# Patient Record
Sex: Male | Born: 1990 | Race: Black or African American | Hispanic: No | Marital: Single | Smoking: Current every day smoker
Health system: Southern US, Community
[De-identification: ages and names within clinical notes are randomized; demographics above are authoritative.]

---
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
## 2012-11-02 ENCOUNTER — Emergency Department: Payer: Self-pay | Admitting: Emergency Medicine

## 2014-01-07 ENCOUNTER — Emergency Department: Payer: Self-pay | Admitting: Emergency Medicine

## 2018-08-11 ENCOUNTER — Other Ambulatory Visit: Payer: Self-pay

## 2018-08-11 ENCOUNTER — Encounter: Payer: Self-pay | Admitting: Emergency Medicine

## 2018-08-11 ENCOUNTER — Emergency Department
Admission: EM | Admit: 2018-08-11 | Discharge: 2018-08-11 | Disposition: A | Discharge status: Home / Self Care | Payer: Self-pay | Attending: Emergency Medicine | Admitting: Emergency Medicine

## 2018-08-11 DIAGNOSIS — K029 Dental caries, unspecified: Secondary | ICD-10-CM | POA: Insufficient documentation

## 2018-08-11 DIAGNOSIS — F17210 Nicotine dependence, cigarettes, uncomplicated: Secondary | ICD-10-CM | POA: Insufficient documentation

## 2018-08-11 MED ORDER — LIDOCAINE VISCOUS HCL 2 % MT SOLN: 10.0000 mL | OROMUCOSAL | 0 refills | Status: DC | PRN

## 2018-08-11 MED ORDER — IBUPROFEN 600 MG PO TABS: 600.0000 mg | ORAL_TABLET | Freq: Four times a day (QID) | ORAL | 0 refills | Status: DC | PRN

## 2018-08-11 MED ORDER — LIDOCAINE VISCOUS HCL 2 % MT SOLN
15.0000 mL | Freq: Once | OROMUCOSAL | Status: AC
  Administered 2018-08-11: 15 mL via OROMUCOSAL
  Filled 2018-08-11: qty 15

## 2018-08-11 MED ORDER — AMOXICILLIN 500 MG PO CAPS: 500.0000 mg | ORAL_CAPSULE | Freq: Three times a day (TID) | ORAL | 0 refills | Status: DC

## 2018-08-11 MED ORDER — KETOROLAC TROMETHAMINE 30 MG/ML IJ SOLN
30.0000 mg | Freq: Once | INTRAMUSCULAR | Status: AC
  Administered 2018-08-11: 30 mg via INTRAMUSCULAR
  Filled 2018-08-11: qty 1

## 2018-08-11 NOTE — ED Triage Notes (Signed)
C/O left upper jaw dental pain x 3 days. 

## 2018-08-11 NOTE — ED Provider Notes (Signed)
[facility] Emergency Department Provider Note  ____________________________________________  Time seen: Approximately 8:39 AM  I have reviewed the triage vital signs and the nursing notes.   HISTORY  Chief Complaint Dental Pain    HPI Marcus Wheeler is a 27 y.o. male presents emergency department for evaluation of left top back dental pain for 1 day.  Patient does not have a dentist.  He states that he knows he has many cavities.  No fever, chills, difficulty opening or closing mouth.  History reviewed. No pertinent past medical history.  There are no active problems to display for this patient.   History reviewed. No pertinent surgical history.  Prior to Admission medications   Medication Sig Start Date End Date Taking? Authorizing Provider  amoxicillin (AMOXIL) 500 MG capsule Take 1 capsule (500 mg total) by mouth 3 (three) times daily. 08/11/18   Enid DerryWagner, Azlaan Isidore, PA-C  ibuprofen (ADVIL,MOTRIN) 600 MG tablet Take 1 tablet (600 mg total) by mouth every 6 (six) hours as needed. 08/11/18   Enid DerryWagner, Bellanie Matthew, PA-C  lidocaine (XYLOCAINE) 2 % solution Use as directed 10 mLs in the mouth or throat as needed for mouth pain. 08/11/18   Enid DerryWagner, Bernadine Melecio, PA-C    Allergies Patient has no known allergies.  No family history on file.  Social History Social History   Tobacco Use  . Smoking status: Current Every Day Smoker    Types: Cigarettes  . Smokeless tobacco: Never Used  Substance Use Topics  . Alcohol use: Not on file  . Drug use: Not on file     Review of Systems  Constitutional: No fever/chills Cardiovascular: No chest pain. Respiratory: No SOB. Gastrointestinal: No abdominal pain.  No nausea, no vomiting.  Musculoskeletal: Negative for musculoskeletal pain. Skin: Negative for rash, abrasions, lacerations, ecchymosis. Neurological: Negative for headaches   ____________________________________________   PHYSICAL EXAM:  VITAL SIGNS: ED  Triage Vitals  Enc Vitals Group     BP 08/11/18 0755 (!) 162/107     Pulse Rate 08/11/18 0755 64     Resp 08/11/18 0755 16     Temp 08/11/18 0755 98.6 F (37 C)     Temp Source 08/11/18 0755 Oral     SpO2 08/11/18 0755 93 %     Weight 08/11/18 0752 246 lb (111.6 kg)     Height --      Head Circumference --      Peak Flow --      Pain Score 08/11/18 0752 10     Pain Loc --      Pain Edu? --      Excl. in GC? --      Constitutional: Alert and oriented. Well appearing and in no acute distress. Eyes: Conjunctivae are normal. PERRL. EOMI. Head: Atraumatic. ENT:      Ears:      Nose: No congestion/rhinnorhea.      Mouth/Throat: Mucous membranes are moist.  Poor dentition.  Visible cavities to dentition. Tenderness to #12, 13. No swelling. No difficulty opening or closing mouth.  Neck: No stridor. Cardiovascular: Normal rate, regular rhythm.  Good peripheral circulation. Respiratory: Normal respiratory effort without tachypnea or retractions. Lungs CTAB. Good air entry to the bases with no decreased or absent breath sounds. Musculoskeletal: Full range of motion to all extremities. No gross deformities appreciated. Neurologic:  Normal speech and language. No gross focal neurologic deficits are appreciated.  Skin:  Skin is warm, dry and intact. No rash noted. Psychiatric: Mood and affect are  normal. Speech and behavior are normal. Patient exhibits appropriate insight and judgement.   ____________________________________________   LABS (all labs ordered are listed, but only abnormal results are displayed)  Labs Reviewed - No data to display ____________________________________________  EKG   ____________________________________________  RADIOLOGY   No results found.  ____________________________________________    PROCEDURES  Procedure(s) performed:    Procedures    Medications  ketorolac (TORADOL) 30 MG/ML injection 30 mg (30 mg Intramuscular Given 08/11/18  0856)  lidocaine (XYLOCAINE) 2 % viscous mouth solution 15 mL (15 mLs Mouth/Throat Given 08/11/18 0856)     ____________________________________________   INITIAL IMPRESSION / ASSESSMENT AND PLAN / ED COURSE  Pertinent labs & imaging results that were available during my care of the patient were reviewed by me and considered in my medical decision making (see chart for details).  Review of the [place] CSRS was performed in accordance of the [facility] prior to dispensing any controlled drugs.     Patient's diagnosis is consistent with dental caries.  Patient will be discharged home with prescriptions for amoxicillin to cover for infection. Patient is to follow up with dentist as directed.  Dental resources were given.  Patient is given ED precautions to return to the ED for any worsening or new symptoms.     ____________________________________________  FINAL CLINICAL IMPRESSION(S) / ED DIAGNOSES  Final diagnoses:  Dental caries      NEW MEDICATIONS STARTED DURING THIS VISIT:  ED Discharge Orders         Ordered    amoxicillin (AMOXIL) 500 MG capsule  3 times daily     08/11/18 0905    lidocaine (XYLOCAINE) 2 % solution  As needed     08/11/18 0905    ibuprofen (ADVIL,MOTRIN) 600 MG tablet  Every 6 hours PRN     08/11/18 0905              This chart was dictated using voice recognition software/Dragon. Despite best efforts to proofread, errors can occur which can change the meaning. Any change was purely unintentional.    Enid DerryWagner, Elnita Surprenant, PA-C 08/11/18 1203    Sharman CheekStafford, Phillip, MD 08/20/18 [phone]

## 2018-08-11 NOTE — Discharge Instructions (Addendum)
OPTIONS FOR DENTAL FOLLOW UP CARE ° °[place] Department of Health and Human Services - Local Safety Net Dental Clinics °http://www.ncdhhs.gov/dph/oralhealth/services/safetynetclinics.htm °  °[facility] (336-562-3123) ° °[facility] [place] (919-933-9087) ° °[facility] ([phone] ext [phone]) ° °[facility] ([phone]) ° °[facility] ([phone]) °This clinic caters to the indigent population and is on a lottery system. °Location: °[facility], [address], [place] °Clinic Hours: °Wednesdays from 6pm - 9pm, patients seen by a lottery system. °For dates, call or go to [email or web address].[facility].edu/[facility]/patients/Dental-[facility] °Services: °Cleanings, fillings and simple extractions. °Payment Options: °DENTAL WORK IS FREE OF CHARGE. Bring proof of income or support. °Best way to get seen: °Arrive at 5:15 pm - this is a lottery, NOT first come/first serve, so arriving earlier will not increase your chances of being seen. °  °  °[facility] °[phone] °Select option 1 for emergencies °  °Location: °[facility], [address], [place] °Clinic Hours: °No walk-ins accepted - call the day before to schedule an appointment. °Check in times are 9:30 am and 1:30 pm. °Services: °Simple extractions, temporary fillings, pulpectomy/pulp debridement, uncomplicated abscess drainage. °Payment Options: °PAYMENT IS DUE AT THE TIME OF SERVICE.  Fee is usually $100-200, additional surgical procedures (e.g. abscess drainage) may be extra. °Cash, checks, Visa/MasterCard accepted.  Can file Medicaid if patient is covered for dental - patient should call case worker to check. °No discount for [facility] patients. °Best way to get seen: °MUST call the day before and get onto the schedule. Can usually be seen the next 1-2 days. No walk-ins accepted. °  °  °[facility] °[phone] °   °Location: °[facility], [address], [place] °Clinic Hours: °M, W, Th, F 8am or 1:30pm, Tues 9a or 1:30 - first come/first served. °Services: °Simple extractions, temporary fillings, uncomplicated abscess drainage.  You do not need to be an [place] resident. °Payment Options: °PAYMENT IS DUE AT THE TIME OF SERVICE. °Dental insurance, otherwise sliding scale - bring proof of income or support. °Depending on income and treatment needed, cost is usually $50-200. °Best way to get seen: °Arrive early as it is first come/first served. °  °  °[facility] °[phone] °  °Location: °[address] °Clinic Hours: °Mon-Thu 8a-5p °Services: °Most basic dental services including extractions and fillings. °Payment Options: °PAYMENT IS DUE AT THE TIME OF SERVICE. °Sliding scale, up to 50% off - bring proof if income or support. °Medicaid with dental option accepted. °Best way to get seen: °Call to schedule an appointment, can usually be seen within 2 weeks OR they will try to see walk-ins - show up at 8a or 2p (you may have to wait). °  °  °[facility] °919-245-2435 °[place] RESIDENTS ONLY °  °Location: °[facility], [address], [place], [place] [address] °Clinic Hours: By appointment only. °Monday - Thursday 8am-5pm, Friday 8am-12pm °Services: Cleanings, fillings, extractions. °Payment Options: °PAYMENT IS DUE AT THE TIME OF SERVICE. °Cash, Visa or MasterCard. Sliding scale - $30 minimum per service. °Best way to get seen: °Come in to office, complete packet and make an appointment - need proof of income °or support monies for each household member and proof of [place] residence. °Usually takes about a month to get in. °  °  °[facility] °[phone] °  °Location: °[address]   [place] °Clinic Hours: Walk-in Urgent Care Dental Services are offered Monday-Friday  mornings only. °The numbers of emergencies accepted daily is limited to the number of °providers available. °Maximum 15 - Mondays, Wednesdays & Thursdays °Maximum 10 - Tuesdays & Fridays °Services: °You do not need to be a [place] resident to be seen for a dental emergency. °Emergencies are defined as pain, swelling, abnormal bleeding, or dental trauma. Walkins will receive x-rays if needed. °NOTE: Dental cleaning is not an emergency. °Payment Options: °PAYMENT IS DUE AT THE TIME OF SERVICE. °Minimum co-pay is $40.00 for uninsured patients. °Minimum co-pay is $3.00 for Medicaid with dental coverage. °Dental Insurance is accepted and must be presented at time of visit. °Medicare does not cover dental. °Forms of payment: Cash, credit card, checks. °Best way to get seen: °If not previously registered with the clinic, walk-in dental registration begins at 7:15 am and is on a first come/first serve basis. °If previously registered with the clinic, call to make an appointment. °  °  °[facility] °[phone] °[place] RESIDENTS ONLY °  °Location: °[address], [place], [place] °Clinic Hours: °Mon-Thu 10a-2p °Services: Extractions only! °Payment Options: °FREE (donations accepted) - bring proof of income or support °Best way to get seen: °Call and schedule an appointment OR come at 8am on the 1st Monday of every month (except for holidays) when it is first come/first served. °  °  °[facility] °[phone] °  °Location: °[address], [place] °Clinic Hours: °Friday mornings °Services, Payment Options, Best way to get seen: °Call for info °

## 2022-04-04 ENCOUNTER — Ambulatory Visit (INDEPENDENT_AMBULATORY_CARE_PROVIDER_SITE_OTHER): Payer: Self-pay | Admitting: Physician Assistant

## 2022-04-04 ENCOUNTER — Encounter: Payer: Self-pay | Admitting: Physician Assistant

## 2022-04-04 VITALS — BP 124/79 | HR 90 | Temp 98.3°F | Resp 16 | Ht 73.0 in | Wt 198.0 lb

## 2022-04-04 DIAGNOSIS — Z13228 Encounter for screening for other metabolic disorders: Secondary | ICD-10-CM

## 2022-04-04 DIAGNOSIS — Z136 Encounter for screening for cardiovascular disorders: Secondary | ICD-10-CM

## 2022-04-04 DIAGNOSIS — Z0000 Encounter for general adult medical examination without abnormal findings: Secondary | ICD-10-CM

## 2022-04-04 DIAGNOSIS — M7989 Other specified soft tissue disorders: Secondary | ICD-10-CM

## 2022-04-04 DIAGNOSIS — Z114 Encounter for screening for human immunodeficiency virus [HIV]: Secondary | ICD-10-CM

## 2022-04-04 DIAGNOSIS — Z23 Encounter for immunization: Secondary | ICD-10-CM

## 2022-04-04 DIAGNOSIS — Z1159 Encounter for screening for other viral diseases: Secondary | ICD-10-CM

## 2022-04-04 NOTE — Progress Notes (Unsigned)
I,Kortland Nichols Robinson,acting as a Neurosurgeon for [employer], PA-C.,have documented all relevant documentation on the behalf of Debera Lat, PA-C,as directed by  [employer], PA-C while in the presence of [employer], PA-C.  New Patient Office Visit  Subjective    Patient ID: Marcus Wheeler, male    DOB: 08/29/1990  Age: 31 y.o. MRN: [id number]  CC:  Chief Complaint  Patient presents with   Establish Care/annual physical  Patient is eating a regular diet.  He is not exercising. Patient is feeling well. He is sleeping well. Patient does has concern to discuss today.    HPI Marcus Wheeler is a 31 yr old male presenting to establish care.  Patient reports cyst-like lump under chin.  Believes it's an ingrown hair.  Noticed approximately 1 yr ago.  Can at times be uncomfortable.     Outpatient Encounter Medications as of 04/04/2022  Medication Sig   amoxicillin (AMOXIL) 500 MG capsule Take 1 capsule (500 mg total) by mouth 3 (three) times daily.   ibuprofen (ADVIL,MOTRIN) 600 MG tablet Take 1 tablet (600 mg total) by mouth every 6 (six) hours as needed.   lidocaine (XYLOCAINE) 2 % solution Use as directed 10 mLs in the mouth or throat as needed for mouth pain. (Patient not taking: Reported on 04/04/2022)   No facility-administered encounter medications on file as of 04/04/2022.    History reviewed. No pertinent past medical history.  History reviewed. No pertinent surgical history.  History reviewed. No pertinent family history.  Social History   Socioeconomic History   Marital status: Single    Spouse name: Not on file   Number of children: Not on file   Years of education: Not on file   Highest education level: Not on file  Occupational History   Not on file  Tobacco Use   Smoking status: Every Day    Packs/day: 1.50    Types: Cigarettes   Smokeless tobacco: Never  Vaping Use   Vaping Use: Not on file  Substance and Sexual Activity   Alcohol use: Yes   Drug use: Yes     Types: Marijuana   Sexual activity: Yes    Birth control/protection: Condom  Other Topics Concern   Not on file  Social History Narrative   Not on file   Social Determinants of Health   Financial Resource Strain: Not on file  Food Insecurity: Not on file  Transportation Needs: Not on file  Physical Activity: Not on file  Stress: Not on file  Social Connections: Not on file  Intimate Partner Violence: Not on file    Review of Systems  All other systems reviewed and are negative.  Except lump under the chin     Objective    BP 124/79 (BP Location: Right Arm, Patient Position: Sitting, Cuff Size: Normal)   Pulse 90   Temp 98.3 F (36.8 C) (Oral)   Resp 16   Ht 6\' 1"  (1.854 m)   Wt 198 lb (89.8 kg)   SpO2 100%   BMI 26.12 kg/m   Physical Exam Vitals reviewed.  Constitutional:      General: He is not in acute distress.    Appearance: Normal appearance. He is well-developed. He is not diaphoretic.  HENT:     Head: Normocephalic and atraumatic.     Right Ear: Tympanic membrane, ear canal and external ear normal.     Left Ear: Tympanic membrane, ear canal and external ear normal.  Nose: Nose normal.     Mouth/Throat:     Mouth: Mucous membranes are moist.     Pharynx: Oropharynx is clear. No oropharyngeal exudate.  Eyes:     General: No scleral icterus.    Conjunctiva/sclera: Conjunctivae normal.     Pupils: Pupils are equal, round, and reactive to light.  Neck:     Thyroid: No thyromegaly.  Cardiovascular:     Rate and Rhythm: Normal rate and regular rhythm.     Pulses: Normal pulses.     Heart sounds: Normal heart sounds. No murmur heard. Pulmonary:     Effort: Pulmonary effort is normal. No respiratory distress.     Breath sounds: Normal breath sounds. No wheezing or rales.  Abdominal:     General: There is no distension.     Palpations: Abdomen is soft.     Tenderness: There is no abdominal tenderness.  Musculoskeletal:        General: No  deformity.     Cervical back: Neck supple.     Right lower leg: No edema.     Left lower leg: No edema.  Lymphadenopathy:     Cervical: No cervical adenopathy.  Skin:    General: Skin is warm and dry.     Findings: No rash.  Neurological:     Mental Status: He is alert and oriented to person, place, and time. Mental status is at baseline.     Sensory: No sensory deficit.     Motor: No weakness.     Gait: Gait normal.  Psychiatric:        Mood and Affect: Mood normal.        Behavior: Behavior normal.        Thought Content: Thought content normal.     {Labs (Optional):23779}    Assessment & Plan:   Problem List Items Addressed This Visit   None 1. Encounter for special screening examination for cardiovascular disorder  - Lipid panel - CBC with Differential/Platelet - Comprehensive metabolic panel  2. Need for hepatitis C screening test  - Hepatitis C antibody  3. Encounter for screening for HIV  - HIV Antibody (routine testing w rflx)  4. Need for tetanus, diphtheria, and acellular pertussis (Tdap) vaccine in patient of adolescent age or older  - Administer Tetanus-diphtheria-acellular pertussis (Tdap) vaccine  5. Screening for Cardiovascular disorder     Screening for metabolic disorder -Lipid panel  6. Cyst of soft tissue Under his chin and on the V right toe - Ambulatory referral to General Surgery  FU PRN The patient was advised to call back or seek an in-person evaluation if the symptoms worsen or if the condition fails to improve as anticipated.  I discussed the assessment and treatment plan with the patient. The patient was provided an opportunity to ask questions and all were answered. The patient agreed with the plan and demonstrated an understanding of the instructions.  The entirety of the information documented in the History of Present Illness, Review of Systems and Physical Exam were personally obtained by me. Portions of this information were  initially documented by the CMA and reviewed by me for thoroughness and accuracy.  Portions of this note were created using dictation software and may contain typographical errors.  Debera Lat, [facility], [facility], [place] [phone] (phone) [phone] (fax)

## 2022-04-05 DIAGNOSIS — Z114 Encounter for screening for human immunodeficiency virus [HIV]: Secondary | ICD-10-CM | POA: Insufficient documentation

## 2022-04-05 DIAGNOSIS — Z23 Encounter for immunization: Secondary | ICD-10-CM | POA: Insufficient documentation

## 2022-04-05 DIAGNOSIS — Z0000 Encounter for general adult medical examination without abnormal findings: Secondary | ICD-10-CM | POA: Insufficient documentation

## 2022-04-05 DIAGNOSIS — Z13228 Encounter for screening for other metabolic disorders: Secondary | ICD-10-CM | POA: Insufficient documentation

## 2022-04-05 DIAGNOSIS — Z1159 Encounter for screening for other viral diseases: Secondary | ICD-10-CM | POA: Insufficient documentation

## 2022-04-20 ENCOUNTER — Encounter: Payer: Self-pay | Admitting: Surgery

## 2022-04-20 ENCOUNTER — Ambulatory Visit (INDEPENDENT_AMBULATORY_CARE_PROVIDER_SITE_OTHER): Payer: Self-pay | Admitting: Surgery

## 2022-04-20 ENCOUNTER — Other Ambulatory Visit: Payer: Self-pay

## 2022-04-20 VITALS — BP 146/88 | HR 81 | Temp 98.2°F | Ht 73.0 in | Wt 201.0 lb

## 2022-04-20 DIAGNOSIS — L723 Sebaceous cyst: Secondary | ICD-10-CM

## 2022-04-20 MED ORDER — SULFAMETHOXAZOLE-TRIMETHOPRIM 800-160 MG PO TABS: 1.0000 | ORAL_TABLET | Freq: Two times a day (BID) | ORAL | 0 refills | Status: DC

## 2022-04-20 NOTE — Patient Instructions (Signed)
Please see your follow up appointment listed below.     Epidermoid Cyst Removal Epidermoid cyst removal is a procedure to remove a fluid-filled sac that forms under your skin (epidermoid cyst). This type of cyst is filled with a thick, oily substance (keratin) that is secreted by your skin glands. Epidermoid cysts may also be called epidermal cysts, or keratin cysts. Normally, the skin secretes this pasty material through a gland or a hair follicle. However, when a skin gland or hair follicle becomes blocked, an epidermoid cyst can form. You may need this procedure if you have an epidermal cyst that becomes large, uncomfortable, or inflamed. Tell a health care provider about: Any allergies you have. All medicines you are taking, including vitamins, herbs, eye drops, creams, and over-the-counter medicines. Any problems you or family members have had with anesthetic medicines. Any blood disorders you have. Any surgeries you have had. Any medical conditions you have now or have had. Whether you are pregnant or may be pregnant. What are the risks? Generally, this is a safe procedure. However, problems may occur, including: Recurrence of the cyst. Bleeding. Infection. Scarring. What happens before the procedure? Ask your health care provider about: Changing or stopping your regular medicines. This is especially important if you are taking diabetes medicines or blood thinners. Taking medicines such as aspirin and ibuprofen. These medicines can thin your blood. Do not take these medicines unless your health care provider tells you to take them. Taking over-the-counter medicines, vitamins, herbs, and supplements. If you have an inflamed or infected cyst, you may have to take antibiotic medicine before the cyst removal. Take your antibiotic as told by your health care provider. Do not stop taking the antibiotic even if you start to feel better. Take a shower on the morning of your procedure. Your  health care provider may ask you to use a germ-killing soap. What happens during the procedure?  You will be given a medicine to numb the area (local anesthetic). The skin around the cyst will be cleaned with a germ-killing solution. The health care provider will make a small incision in your skin over the cyst. The health care provider will separate the cyst from the surrounding tissues that are under your skin. If possible, the cyst will be removed undamaged (intact). If the cyst bursts (ruptures), it will be removed in pieces. After the cyst is removed, the health care provider will control any bleeding and close the incision with small stitches (sutures). Small incisions may not need sutures, and the bleeding will be controlled by applying direct pressure with gauze. The health care provider may apply antibiotic ointment and a bandage (dressing) over the incision. The procedure may vary among health care providers and hospitals. What happens after the procedure? If you are prescribed an antibiotic medicine or ointment, take or apply it as told by your health care provider. Do not stop using the antibiotic even if you start to feel better. Summary Epidermoid cyst removal is a procedure to remove a sac that has formed under your skin. You may need this procedure if you have an epidermoid cyst that becomes large, uncomfortable, or inflamed. The health care provider will make a small incision in your skin to remove the cyst. If you are prescribed an antibiotic medicine before the procedure, after the procedure, or both, use the antibiotic as told by your health care provider. Do not stop using the antibiotic even if you start to feel better. This information is not intended to  replace advice given to you by your health care provider. Make sure you discuss any questions you have with your health care provider. Document Revised: 11/06/2019 Document Reviewed: 11/06/2019 Elsevier Patient Education   2023 [employer].

## 2022-04-20 NOTE — Progress Notes (Signed)
04/20/2022  Reason for Visit:  Sebaceous cyst of the chin  Requesting Provider:  Debera Lat, PA-C  History of Present Illness: Marcus Wheeler is a 31 y.o. male presenting for evaluation of a sebaceous cyst under the chin.  The patient reports that he's had this for a couple years, and has been fluctuating in size, sometimes being smaller and sometimes being larger.  Typically when it flares up, it goes down on its own.  He recently saw his PCP on 04/04/22 and at the time the cyst was not going down in size.  That same night, the cyst burst and drained some on its own.  He reports having some purulent fluid drainage.  Since then, the skin has healed but the cyst is starting to grow in size again.  Denies any worsening pain at this point.  Denies any fevers, chills, chest pain, shortness of breath.  Past Medical History: History reviewed. No pertinent past medical history.   Past Surgical History: History reviewed. No pertinent surgical history.  Home Medications: Prior to Admission medications   Medication Sig Start Date End Date Taking? Authorizing Provider  sulfamethoxazole-trimethoprim (BACTRIM DS) 800-160 MG tablet Take 1 tablet by mouth 2 (two) times daily. 04/20/22  Yes Reyli Schroth, Elita Quick, MD    Allergies: No Known Allergies  Social History:  reports that he has been smoking cigarettes. He has been smoking an average of 1.5 packs per day. He has never used smokeless tobacco. He reports current alcohol use. He reports current drug use. Drug: Marijuana.   Family History: Family History  Problem Relation Age of Onset   Diabetes Father     Review of Systems: Review of Systems  Constitutional:  Negative for chills and fever.  Respiratory:  Negative for shortness of breath.   Cardiovascular:  Negative for chest pain.  Gastrointestinal:  Negative for abdominal pain, nausea and vomiting.  Skin:        Cyst under his chin, with recent drainage.    Physical Exam BP (!) 146/88    Pulse 81   Temp 98.2 F (36.8 C) (Oral)   Ht 6\' 1"  (1.854 m)   Wt 201 lb (91.2 kg)   SpO2 99%   BMI 26.52 kg/m  CONSTITUTIONAL: No acute distress, well nourished. HEENT:  Normocephalic, atraumatic, extraocular motion intact. RESPIRATORY:  Normal respiratory effort without pathologic use of accessory muscles. CARDIOVASCULAR: Regular rhythm and rate. MUSCULOSKELETAL:  Normal muscle strength and tone in all four extremities.  No peripheral edema or cyanosis. SKIN: The patient has an approximately 2 cm mass under his chin, which is soft and fluctuant, consistent with a sebaceous cyst.  No active drainage at this point.  He has some other skin pores/blackheads around his skin. NEUROLOGIC:  Motor and sensation is grossly normal.  Cranial nerves are grossly intact. PSYCH:  Alert and oriented to person, place and time. Affect is normal.  Laboratory Analysis: No results found for this or any previous visit (from the past 24 hour(s)).  Imaging: No results found.  Assessment and Plan: This is a 31 y.o. male with a sebaceous cyst under his chin.  --Discussed with patient that the mass under his chin is a sebaceous cyst and discussed how they can form and get infected.  Based on his description, he's had minor flareups of infection that resolve on their own, and the cyst recently drained spontaneously.  As a precaution, will give him a course of Bactrim x 7 days and will have him come back  in 2 weeks for formal cyst excision in the office.  Discussed the procedure with him and the reasoning for doing antibiotics and waiting two weeks prior to excision, and he understands. --Follow up in 2 weeks.  I spent 30 minutes dedicated to the care of this patient on the date of this encounter to include pre-visit review of records, face-to-face time with the patient discussing diagnosis and management, and any post-visit coordination of care.   Marcus Ill, MD  [facility]

## 2022-05-04 ENCOUNTER — Ambulatory Visit (INDEPENDENT_AMBULATORY_CARE_PROVIDER_SITE_OTHER): Payer: Self-pay | Admitting: Surgery

## 2022-05-04 ENCOUNTER — Encounter: Payer: Self-pay | Admitting: Surgery

## 2022-05-04 VITALS — BP 116/75 | HR 85 | Temp 98.3°F | Ht 73.0 in | Wt 200.0 lb

## 2022-05-04 DIAGNOSIS — L723 Sebaceous cyst: Secondary | ICD-10-CM

## 2022-05-04 NOTE — Progress Notes (Signed)
  Procedure Date:  05/04/2022  Pre-operative Diagnosis:  Sebaceous cyst of chin  Post-operative Diagnosis:  Sebaceous cyst of chin, 2.5 cm  Procedure:  Excision of sebaceous cyst of chin  Surgeon:  Melvyn Neth, MD  Anesthesia:  6 ml 1% lidocaine  Estimated Blood Loss:  4 ml  Specimens:  None  Complications:  None  Indications for Procedure:  This is a 31 y.o. male with diagnosis of a symptomatic sebaceous cyst of the chin.  The patient wishes to have this excised. The risks of bleeding, abscess or infection, injury to surrounding structures, and need for further procedures were all discussed with the patient and he was willing to proceed.  Description of Procedure: The patient was correctly identified at bedside.  The patient was placed supine.  Appropriate time-outs were performed.  The patient's chin was prepped and draped in usual sterile fashion.  Local anesthetic was infused intradermally.  A 2.5 cm incision was made over the cyst, and scalpel was used to dissect down the skin and subcutaneous tissue.  Skin flaps were created sharply, and then the cyst was excised.  There was spillage of cyst contents, but was not infected.  The cavity was then irrigated and hemostasis was assured with manual pressure.  The wound was then closed in two layers using 3-0 Vicryl and 4-0 Monocryl.  The incision was cleaned and sealed with DermaBond.  The patient tolerated the procedure well and all sharps were appropriately disposed of at the end of the case.  --May shower tomorrow --May take Tylenol or Ibuprofen for pain control. --Follow up next week for wound check.  Melvyn Neth, MD

## 2022-05-04 NOTE — Patient Instructions (Addendum)
We have removed a Cyst in our office today.  You have sutures under the skin that will dissolve and also dermabond (skin glue) on top of your skin which will come off on it's own in 10-14 days.  You may use Ibuprofen or Tylenol as needed for pain control. Use the ice pack 3-4 times a day for the next two days for any achiness.  You may shower tomorrow, do not scrub at the area.  Avoid Strenuous activities that will make you sweat during the next 48 hours to avoid the glue coming off prematurely. Avoid activities that will place pressure to this area of the body for 1-2 weeks to avoid re-injury to incision site.  Please see your follow-up appointment provided. We will see you back in office to make sure this area is healed. If you have any questions or concerns prior to this appointment, call our office and speak with a nurse.    Excision of Skin Cysts or Lesions Excision of a skin lesion refers to the removal of a section of skin by making small cuts (incisions) in the skin. This procedure may be done to remove a non cancerous (benign) growth on the skin. It is typically done to treat or prevent cancer or infection. It may also be done to improve cosmetic appearance. The procedure may be done to remove: Non cancerous growths, such as a cyst or lipoma. Growths, such as moles or skin tags, which may be removed for cosmetic reasons.  Various excision or surgical techniques may be used depending on your condition, the location of the lesion, and your overall health. Tell a health care provider about: Any allergies you have. All medicines you are taking, including vitamins, herbs, eye drops, creams, and over-the-counter medicines. Any problems you or family members have had with anesthetic medicines. Any blood disorders you have. Any surgeries you have had. Any medical conditions you have. Whether you are pregnant or may be pregnant. What are the risks? Generally, this is a safe procedure.  However, problems may occur, including: Bleeding. Infection. Scarring. Recurrence of the cyst, lipoma, or cancer. Changes in skin sensation or appearance, such as discoloration or swelling. Reaction to the anesthetics. Allergic reaction to surgical materials or ointments. Damage to nerves, blood vessels, muscles, or other structures. Continued pain.  What happens before the procedure? Ask your health care provider about: Changing or stopping your regular medicines. This is especially important if you are taking diabetes medicines or blood thinners. Taking medicines such as aspirin and ibuprofen. These medicines can thin your blood. Do not take these medicines before your procedure if your health care provider instructs you not to. You may be asked to take certain medicines. You may be asked to stop smoking. You may have an exam or testing. What happens during the procedure? To reduce your risk of infection: Your health care team will wash or sanitize their hands. Your skin will be washed with soap. You will be given a medicine to numb the area (local anesthetic). One of the following excision techniques will be performed. At the end of any of these procedures, antibiotic ointment will be applied as needed. Each of the following techniques may vary among health care providers and hospitals. Complete Surgical Excision The area of skin that needs to be removed will be marked with a pen. Using a small scalpel or scissors, the surgeon will gently cut around and under the lesion until it is completely removed. The lesion will be placed in  a fluid and sent to the lab for examination. If necessary, bleeding will be controlled with a device that delivers heat (electrocautery). The edges of the wound may be stitched (sutured) together, and a bandage (dressing) will be applied.  Excision of a Cyst The surgeon will make an incision on the cyst. The entire cyst will be removed through the incision.  The incision may be closed with sutures.  What happens after the procedure? Return to your normal activities as told by your health care provider.

## 2022-05-12 ENCOUNTER — Encounter: Payer: Self-pay | Admitting: Physician Assistant

## 2022-05-12 ENCOUNTER — Ambulatory Visit (INDEPENDENT_AMBULATORY_CARE_PROVIDER_SITE_OTHER): Payer: Self-pay | Admitting: Physician Assistant

## 2022-05-12 VITALS — BP 127/81 | HR 86 | Temp 98.0°F | Ht 73.0 in | Wt 203.0 lb

## 2022-05-12 DIAGNOSIS — Z09 Encounter for follow-up examination after completed treatment for conditions other than malignant neoplasm: Secondary | ICD-10-CM

## 2022-05-12 DIAGNOSIS — L723 Sebaceous cyst: Secondary | ICD-10-CM

## 2022-05-12 NOTE — Progress Notes (Signed)
[facility] POST-OP OFFICE VISIT  05/12/2022  HPI: Marcus Wheeler is a 31 y.o. male 8 days s/p excision of sebaceous cyst  (2.5) of chin with Dr Hampton Abbot   He is doing well No pain, fever, chills No issues with the incisions No other complaints   Vital signs: BP 127/81   Pulse 86   Temp 98 F (36.7 C)   Ht 6\' 1"  (1.854 m)   Wt 203 lb (92.1 kg)   SpO2 98%   BMI 26.78 kg/m    Physical Exam: Constitutional: Well appearing male, NAD Skin: 2.5 cm incision to the underside of the left chin, well healed  Assessment/Plan: This is a 31 y.o. male 8 days s/p excision of sebaceous cyst  (2.5) of chin with Dr Hampton Abbot    - Pain control prn  - Reviewed wound care recommendation  - He can follow up on as needed basis; He understands to call with questions/concerns  -- Edison Simon, PA-C [facility] 05/12/2022, 3:05 PM M-F: 7am - 4pm

## 2022-05-12 NOTE — Patient Instructions (Signed)
   Follow-up with our office as needed.  Please call and ask to speak with a nurse if you develop questions or concerns.  

## 2022-10-05 ENCOUNTER — Other Ambulatory Visit: Payer: Self-pay

## 2022-10-05 ENCOUNTER — Encounter: Payer: Self-pay | Admitting: Emergency Medicine

## 2022-10-05 ENCOUNTER — Emergency Department
Admission: EM | Admit: 2022-10-05 | Discharge: 2022-10-05 | Disposition: A | Discharge status: Home / Self Care | Payer: Self-pay | Attending: Emergency Medicine | Admitting: Emergency Medicine

## 2022-10-05 DIAGNOSIS — Y990 Civilian activity done for income or pay: Secondary | ICD-10-CM | POA: Insufficient documentation

## 2022-10-05 DIAGNOSIS — W268XXA Contact with other sharp object(s), not elsewhere classified, initial encounter: Secondary | ICD-10-CM | POA: Insufficient documentation

## 2022-10-05 DIAGNOSIS — S61411A Laceration without foreign body of right hand, initial encounter: Secondary | ICD-10-CM | POA: Insufficient documentation

## 2022-10-05 MED ORDER — CEPHALEXIN 500 MG PO CAPS: 1000.0000 mg | ORAL_CAPSULE | Freq: Two times a day (BID) | ORAL | 0 refills | Status: AC

## 2022-10-05 MED ORDER — LIDOCAINE-EPINEPHRINE (PF) 2 %-1:200000 IJ SOLN
10.0000 mL | Freq: Once | INTRAMUSCULAR | Status: AC
  Administered 2022-10-05: 10 mL
  Filled 2022-10-05: qty 20
  Filled 2022-10-05: qty 10

## 2022-10-05 NOTE — Discharge Instructions (Addendum)
-  You will need to have the sutures removed in 10 days.  You may return here, or urgent care/medically to have these removed.  Please limit the amount of mobility with your hand to prevent premature suture rupture.  -Please take the full course of the antibiotics as prescribed to prevent infection.  Please keep the wound covered at all times.  You may wash in the shower with soap and water daily.  -You may take Tylenol or ibuprofen as needed for pain.  -Return to the emergency department anytime if you begin to experience any new or worsening symptoms.

## 2022-10-05 NOTE — ED Provider Notes (Signed)
[facility] Provider Note    Event Date/Time   First MD Initiated Contact with Patient 10/05/22 1204     (approximate)   History   Chief Complaint Laceration   HPI Marcus Wheeler is a 32 y.o. male, no significant medical history, presents to the emergency department for evaluation of laceration to the right hand/thumb.  Patient states he was working when he was accidentally cut with a piece of fiberglass.  Bleeding was controlled with direct pressure.  He is up-to-date with his tetanus immunizations.  Denies any other injuries.  Denies rashes, foreign bodies, paresthesias, or cold sensation in the affected hand.  History Limitations: No limitations.        Physical Exam  Triage Vital Signs: ED Triage Vitals  Enc Vitals Group     BP 10/05/22 1137 (!) 142/83     Pulse Rate 10/05/22 1137 94     Resp 10/05/22 1137 18     Temp 10/05/22 1137 98.4 F (36.9 C)     Temp Source 10/05/22 1137 Oral     SpO2 10/05/22 1137 95 %     Weight --      Height --      Head Circumference --      Peak Flow --      Pain Score 10/05/22 1135 7     Pain Loc --      Pain Edu? --      Excl. in [place]? --     Most recent vital signs: Vitals:   10/05/22 1137  BP: (!) 142/83  Pulse: 94  Resp: 18  Temp: 98.4 F (36.9 C)  SpO2: 95%    General: Awake, NAD.  Skin: Warm, dry. No rashes or lesions.  Eyes: PERRL. Conjunctivae normal.  CV: Good peripheral perfusion.  Resp: Normal effort.  Abd: Soft, non-tender. No distention.  Neuro: At baseline. No gross neurological deficits.  Musculoskeletal: Normal ROM of all extremities.  Focused Exam: 2 cm linear laceration just proximal to the base of the right thumb.  Approximately 2 to 3 mm deep.  No active bleeding or discharge.  No foreign bodies.  Patient maintains normal range of motion at the PIP and DIP joints.  Normal cap refill distally.  Physical Exam    ED Results / Procedures / Treatments  Labs (all labs  ordered are listed, but only abnormal results are displayed) Labs Reviewed - No data to display   EKG N/A.    RADIOLOGY  ED Provider Interpretation: N/A.  No results found.  PROCEDURES:  Critical Care performed: N/A.  Marland Kitchen.Laceration Repair  Date/Time: 10/05/2022 1:06 PM  Performed by: Teodoro Spray, PA Authorized by: Teodoro Spray, PA   Consent:    Consent obtained:  Verbal   Consent given by:  Patient   Risks, benefits, and alternatives were discussed: yes     Risks discussed:  Infection, need for additional repair, nerve damage, poor wound healing, poor cosmetic result, pain, retained foreign body, tendon damage and vascular damage   Alternatives discussed:  No treatment Universal protocol:    Patient identity confirmed:  Verbally with patient Anesthesia:    Anesthesia method:  Local infiltration   Local anesthetic:  Lidocaine 2% WITH epi Laceration details:    Location:  Finger   Finger location:  R thumb   Length (cm):  2   Depth (mm):  3 Exploration:    Wound extent: fascia not violated, no foreign body, no signs of injury,  no nerve damage, no tendon damage, no underlying fracture and no vascular damage   Treatment:    Area cleansed with:  Saline   Irrigation solution:  Sterile saline   Irrigation volume:  1000 ml   Irrigation method:  Pressure wash   Debridement:  None   Undermining:  None Skin repair:    Repair method:  Sutures   Suture size:  5-0   Suture technique:  Simple interrupted   Number of sutures:  3 Approximation:    Approximation:  Close Repair type:    Repair type:  Simple Post-procedure details:    Dressing:  Adhesive bandage   Procedure completion:  Tolerated well, no immediate complications     MEDICATIONS ORDERED IN ED: Medications  lidocaine-EPINEPHrine (XYLOCAINE W/EPI) 2 %-1:200000 (PF) injection 10 mL (10 mLs Infiltration Given by Other 10/05/22 1240)     IMPRESSION / MDM / ASSESSMENT AND PLAN / ED COURSE   I reviewed the triage vital signs and the nursing notes.                              Differential diagnosis includes, but is not limited to, laceration, foreign body, extensor tendon injury,  Assessment/Plan Patient presents with 2 cm laceration just proximal to the base of the thumb after being caught by a piece of fiberglass.  No signs of any tendon or vascular injuries.  No underlying osseous tenderness to warrant x-ray to rule out fracture.  He maintains full range of motion of the affected digit.  I was able to cleanse the wound thoroughly after providing local anesthesia.  Repaired utilizing simple interrupted sutures.  Given the location and nature of the laceration, will place him on antibiotics.  Encouraged him to take Tylenol or ibuprofen as needed for pain.  Advised him that he will need to have his sutures removed in 10 days.  He was agreeable to this.  Will discharge.  Provided the patient with anticipatory guidance, return precautions, and educational material. Encouraged the patient to return to the emergency department at any time if they begin to experience any new or worsening symptoms. Patient expressed understanding and agreed with the plan.   Patient's presentation is most consistent with acute, uncomplicated illness.       FINAL CLINICAL IMPRESSION(S) / ED DIAGNOSES   Final diagnoses:  Laceration of right hand without foreign body, initial encounter     Rx / DC Orders   ED Discharge Orders          Ordered    cephALEXin (KEFLEX) 500 MG capsule  2 times daily        10/05/22 1302             Note:  This document was prepared using Dragon voice recognition software and may include unintentional dictation errors.   Teodoro Spray, [place] 10/05/22 1309    Duffy Bruce, MD 10/05/22 2014

## 2022-10-05 NOTE — ED Triage Notes (Signed)
Patient to ED for laceration to right thumb. Patient states it got cut with a piece of fiber glass. Bleeding controlled at this time.

## 2022-10-17 ENCOUNTER — Encounter: Payer: Self-pay | Admitting: Emergency Medicine

## 2022-10-17 ENCOUNTER — Emergency Department
Admission: EM | Admit: 2022-10-17 | Discharge: 2022-10-17 | Disposition: A | Discharge status: Home / Self Care | Payer: Self-pay | Attending: Emergency Medicine | Admitting: Emergency Medicine

## 2022-10-17 ENCOUNTER — Other Ambulatory Visit: Payer: Self-pay

## 2022-10-17 DIAGNOSIS — Z4802 Encounter for removal of sutures: Secondary | ICD-10-CM | POA: Insufficient documentation

## 2022-10-17 NOTE — ED Provider Notes (Signed)
   [facility] Provider Note    Event Date/Time   First MD Initiated Contact with Patient 10/17/22 1034     (approximate)   History   Suture / Staple Removal (Right hand)   HPI  Marcus Wheeler is a 32 y.o. male who presents for suture removal, patient had 3 sutures placed in his thumb 10 days ago, wound is well-healed     Physical Exam   Triage Vital Signs: ED Triage Vitals  Enc Vitals Group     BP 10/17/22 1030 (!) 145/89     Pulse Rate 10/17/22 1030 77     Resp 10/17/22 1030 16     Temp 10/17/22 1030 98.9 F (37.2 C)     Temp Source 10/17/22 1030 Oral     SpO2 10/17/22 1030 97 %     Weight 10/17/22 1030 95.3 kg (210 lb)     Height 10/17/22 1030 1.854 m ('6\' 1"'$ )     Head Circumference --      Peak Flow --      Pain Score 10/17/22 1021 0     Pain Loc --      Pain Edu? --      Excl. in [place]? --     Most recent vital signs: Vitals:   10/17/22 1030  BP: (!) 145/89  Pulse: 77  Resp: 16  Temp: 98.9 F (37.2 C)  SpO2: 97%     General: Awake, no distress.  CV:  Good peripheral perfusion.  Resp:  Norml effort.  Abd:  No distention.  Other:     ED Results / Procedures / Treatments   Labs (all labs ordered are listed, but only abnormal results are displayed) Labs Reviewed - No data to display   EKG     RADIOLOGY     PROCEDURES:  Critical Care performed:  Procedures   MEDICATIONS ORDERED IN ED: Medications - No data to display   IMPRESSION / MDM / [place] / ED COURSE  I reviewed the triage vital signs and the nursing notes. Patient's presentation is most consistent with acute, uncomplicated illness.   3 sutures removed, no dehiscence, wound well-healed       FINAL CLINICAL IMPRESSION(S) / ED DIAGNOSES   Final diagnoses:  Visit for suture removal     Rx / DC Orders   ED Discharge Orders     None        Note:  This document was prepared using Dragon voice recognition software and may  include unintentional dictation errors.   Lavonia Drafts, MD 10/17/22 1044

## 2022-10-17 NOTE — ED Triage Notes (Signed)
Pt states coming in due to needing sutures removed from the right posterior thumb. Pt states it has been 10 days since they were placed.
